# Patient Record
Sex: Male | Born: 1945 | Race: White | Hispanic: No | Marital: Married | Smoking: Former smoker
Health system: Southern US, Community
[De-identification: ages and names within clinical notes are randomized; demographics above are authoritative.]

## PROBLEM LIST (undated history)

## (undated) DIAGNOSIS — I4891 Unspecified atrial fibrillation: Secondary | ICD-10-CM

## (undated) DIAGNOSIS — F0390 Unspecified dementia without behavioral disturbance: Secondary | ICD-10-CM

---

## 2013-02-01 ENCOUNTER — Encounter (HOSPITAL_COMMUNITY): Payer: Self-pay | Admitting: Emergency Medicine

## 2013-02-01 ENCOUNTER — Emergency Department (HOSPITAL_COMMUNITY): Payer: Medicare Other

## 2013-02-01 ENCOUNTER — Emergency Department (HOSPITAL_COMMUNITY)
Admission: EM | Admit: 2013-02-01 | Discharge: 2013-02-01 | Disposition: A | Discharge status: Skilled Nursing Facility | Payer: Medicare Other | Attending: Emergency Medicine | Admitting: Emergency Medicine

## 2013-02-01 DIAGNOSIS — S0093XA Contusion of unspecified part of head, initial encounter: Secondary | ICD-10-CM

## 2013-02-01 DIAGNOSIS — Y92009 Unspecified place in unspecified non-institutional (private) residence as the place of occurrence of the external cause: Secondary | ICD-10-CM | POA: Insufficient documentation

## 2013-02-01 DIAGNOSIS — Z79899 Other long term (current) drug therapy: Secondary | ICD-10-CM | POA: Insufficient documentation

## 2013-02-01 DIAGNOSIS — IMO0002 Reserved for concepts with insufficient information to code with codable children: Secondary | ICD-10-CM | POA: Insufficient documentation

## 2013-02-01 DIAGNOSIS — S20312A Abrasion of left front wall of thorax, initial encounter: Secondary | ICD-10-CM

## 2013-02-01 DIAGNOSIS — W1809XA Striking against other object with subsequent fall, initial encounter: Secondary | ICD-10-CM | POA: Insufficient documentation

## 2013-02-01 DIAGNOSIS — Y9389 Activity, other specified: Secondary | ICD-10-CM | POA: Insufficient documentation

## 2013-02-01 DIAGNOSIS — W19XXXA Unspecified fall, initial encounter: Secondary | ICD-10-CM

## 2013-02-01 DIAGNOSIS — S0003XA Contusion of scalp, initial encounter: Secondary | ICD-10-CM | POA: Insufficient documentation

## 2013-02-01 DIAGNOSIS — S1093XA Contusion of unspecified part of neck, initial encounter: Secondary | ICD-10-CM | POA: Insufficient documentation

## 2013-02-01 DIAGNOSIS — F0390 Unspecified dementia without behavioral disturbance: Secondary | ICD-10-CM | POA: Insufficient documentation

## 2013-02-01 DIAGNOSIS — Z87891 Personal history of nicotine dependence: Secondary | ICD-10-CM | POA: Insufficient documentation

## 2013-02-01 DIAGNOSIS — Z8679 Personal history of other diseases of the circulatory system: Secondary | ICD-10-CM | POA: Insufficient documentation

## 2013-02-01 HISTORY — DX: Unspecified dementia, unspecified severity, without behavioral disturbance, psychotic disturbance, mood disturbance, and anxiety: F03.90

## 2013-02-01 HISTORY — DX: Unspecified atrial fibrillation: I48.91

## 2013-02-01 NOTE — ED Notes (Signed)
Report called to [facility]. 

## 2013-02-01 NOTE — ED Notes (Signed)
Pt was standing up and fell hitting his head on the floor and has a hematoma to back side of head. Pt is alert and oriented, no loc, was able to get back up per protocol was sent to ED. Come from [facility]

## 2013-02-01 NOTE — ED Provider Notes (Signed)
History    CSN: [id number] Arrival date & time 02/01/13  1657  First MD Initiated Contact with Patient 02/01/13 1720      Chief complaint fall  Level V caveat for dementia  (Consider location/radiation/quality/duration/timing/severity/associated sxs/prior Treatment) HPI  Wife states she sees the patient daily at his memory unit where he resides for his dementia. She states today and characteristically he was a little bit more aggressive than usual and he was given Seroquel 25 mg that he has as a when necessary order. She states they normally give him a larger dose at bedtime. Within about 30 minutes he fell and hit the back of his head. She states she also noticed he has a scratch on his left chest. She reports he is evaluated frequently there and had a recent urinalysis for possible UTI which was normal. She states it is their policy to have any resident checked when they fall.    PCP Dr Sonia Baller  Past Medical History  Diagnosis Date  . Dementia   . Atrial fibrillation    History reviewed. No pertinent past surgical history.   History reviewed. No pertinent family history.   History  Substance Use Topics  . Smoking status: Former Games developer  . Smokeless tobacco: Not on file  . 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    Allergies  Review of patient's allergies indicates no known allergies.  Home Medications   Current Outpatient Rx  Name  Route  Sig  Dispense  Refill  . ibuprofen (ADVIL,MOTRIN) 200 MG tablet   Oral   Take 200 mg by mouth daily as needed for pain.         Marland Kitchen ibuprofen (ADVIL,MOTRIN) 400 MG tablet   Oral   Take 400 mg by mouth 3 (three) times daily.         Marland Kitchen levothyroxine (SYNTHROID, LEVOTHROID) 25 MCG tablet   Oral   Take 25 mcg by mouth daily before breakfast.         . PARoxetine (PAXIL) 10 MG tablet   Oral   Take 10 mg by mouth at bedtime.  Taken with 40mg  tab to equal 50mg  qhs.         Marland Kitchen PARoxetine (PAXIL) 40 MG tablet   Oral   Take 40 mg by mouth at bedtime. Taken with 10mg  tab to equal 50mg  qhs.         . QUEtiapine (SEROQUEL) 100 MG tablet   Oral   Take 100 mg by mouth at bedtime.         Marland Kitchen QUEtiapine (SEROQUEL) 25 MG tablet   Oral   Take 12.5 mg by mouth 2 (two) times daily as needed (for anxiety/agitation).          BP 128/84  Pulse 68  Temp(Src) 98.5 F (36.9 C) (Oral)  Resp 16  Ht 5\' 11"  (1.803 m)  Wt 170 lb (77.111 kg)  BMI 23.72 kg/m2  SpO2 96%  Vital signs normal   Physical Exam  Nursing note and vitals reviewed. Constitutional: He appears well-developed and well-nourished.  Non-toxic appearance. He does not appear ill. No distress.  HENT:  Head: Normocephalic.    Right Ear: External ear normal.  Left Ear: External ear normal.  Nose: Nose normal. No mucosal edema or rhinorrhea.  Mouth/Throat: Oropharynx is clear and moist and mucous membranes are normal. No dental abscesses or edematous.  Patient has a reddened area of his posterior scalp where he fell, there  are no open lesions.  Eyes: Conjunctivae and EOM are normal. Pupils are equal, round, and reactive to light.  Neck: Normal range of motion and full passive range of motion without pain. Neck supple.  Cardiovascular: Normal rate, regular rhythm and normal heart sounds.  Exam reveals no gallop and no friction rub.   No murmur heard. Pulmonary/Chest: Effort normal and breath sounds normal. No respiratory distress. He has no wheezes. He has no rhonchi. He has no rales. He exhibits no tenderness and no crepitus.    Linear superficial abrasion vertically placed along his left lateral rib cage without swelling or crepitance  Abdominal: Soft. Normal appearance and bowel sounds are normal. He exhibits no distension. There is no tenderness. There is no rebound and no guarding.  Musculoskeletal: Normal range of motion. He exhibits no edema  and no tenderness.  Patient appears to be uncomfortable when I try to flex his right hip. Wife states he has arthritis and he has pain that he takes ibuprofen for  Neurological: He is alert. He has normal strength. No cranial nerve deficit.  Skin: Skin is warm, dry and intact. No rash noted. No erythema. No pallor.  Psychiatric: His speech is normal. His mood appears not anxious.  Patient has very flat affect and appears to be sedated.    ED Course  Procedures (including critical care time)  Wife is agreeable of doing a CT scan of his head and x-rays of the areas that appeared to be having an acute injury.   Dg Chest 1 View  02/01/2013   *RADIOLOGY REPORT*  Clinical Data: Fall, dimension, atrial fibrillation  CHEST - 1 VIEW  Comparison: None.  Findings: Low volume exam with mild cardiomegaly and vascular congestion.  No CHF.  Minimal basilar atelectasis.  No focal pneumonia, collapse or consolidation.  No effusion or pneumothorax. Degenerative changes of the shoulders.  IMPRESSION: Low lung volumes with cardiomegaly and vascular congestion   Original Report Authenticated By: Judie Petit. Miles Costain, M.D.   Ct Head Wo Contrast  02/01/2013   *RADIOLOGY REPORT*  Clinical Data: Fall, head injury  CT HEAD WITHOUT CONTRAST  Technique:  Contiguous axial images were obtained from the base of the skull through the vertex without contrast.  Comparison: None.  Findings: Limited exam because of oblique acquisition.  Brain atrophy evident without acute intracranial hemorrhage, mass lesion, definite infarction, midline shift, herniation, hydrocephalus, or extra-axial fluid collection.  Gray-white matter differentiation maintained.  Cisterns patent.  Cerebellar atrophy as well. Symmetric orbits.  Mastoids and sinuses clear.  No skull abnormality.  IMPRESSION: Atrophy without acute intracranial process   Original Report Authenticated By: Judie Petit. Miles Costain, M.D.   Dg Pelvis Portable  02/01/2013   *RADIOLOGY REPORT*  Clinical Data:  Fall, hip pain  PORTABLE PELVIS  Comparison: None.  Findings: Hips appear symmetric and located.  No displaced fracture.  Visualized bony pelvis intact.  Postop changes in the pelvis.  Normal SI joints.  Nonobstructive bowel gas pattern.  IMPRESSION: No acute osseous finding   Original Report Authenticated By: Judie Petit. Miles Costain, M.D.   1. Fall at nursing home, initial encounter   2. Contusion of head, initial encounter   3. Abrasion of chest wall, left, initial encounter     Plan discharge  Devoria Albe, MD, FACEP   MDM    Ward Givens, MD 02/01/13 [phone]

## 2014-03-05 ENCOUNTER — Encounter (HOSPITAL_COMMUNITY): Payer: Self-pay | Admitting: Emergency Medicine

## 2014-03-05 ENCOUNTER — Emergency Department (HOSPITAL_COMMUNITY)
Admission: EM | Admit: 2014-03-05 | Discharge: 2014-03-05 | Disposition: A | Discharge status: Home / Self Care | Payer: Medicare Other | Attending: Emergency Medicine | Admitting: Emergency Medicine

## 2014-03-05 DIAGNOSIS — Z79899 Other long term (current) drug therapy: Secondary | ICD-10-CM | POA: Diagnosis not present

## 2014-03-05 DIAGNOSIS — S0191XA Laceration without foreign body of unspecified part of head, initial encounter: Secondary | ICD-10-CM

## 2014-03-05 DIAGNOSIS — Y929 Unspecified place or not applicable: Secondary | ICD-10-CM | POA: Diagnosis not present

## 2014-03-05 DIAGNOSIS — S0990XA Unspecified injury of head, initial encounter: Secondary | ICD-10-CM | POA: Insufficient documentation

## 2014-03-05 DIAGNOSIS — Y9389 Activity, other specified: Secondary | ICD-10-CM | POA: Insufficient documentation

## 2014-03-05 DIAGNOSIS — W1809XA Striking against other object with subsequent fall, initial encounter: Secondary | ICD-10-CM | POA: Insufficient documentation

## 2014-03-05 DIAGNOSIS — Z792 Long term (current) use of antibiotics: Secondary | ICD-10-CM | POA: Insufficient documentation

## 2014-03-05 DIAGNOSIS — F0390 Unspecified dementia without behavioral disturbance: Secondary | ICD-10-CM | POA: Diagnosis not present

## 2014-03-05 DIAGNOSIS — Z8679 Personal history of other diseases of the circulatory system: Secondary | ICD-10-CM | POA: Diagnosis not present

## 2014-03-05 DIAGNOSIS — S0180XA Unspecified open wound of other part of head, initial encounter: Secondary | ICD-10-CM | POA: Diagnosis present

## 2014-03-05 DIAGNOSIS — W050XXA Fall from non-moving wheelchair, initial encounter: Secondary | ICD-10-CM | POA: Diagnosis not present

## 2014-03-05 DIAGNOSIS — Z87891 Personal history of nicotine dependence: Secondary | ICD-10-CM | POA: Insufficient documentation

## 2014-03-05 NOTE — ED Notes (Signed)
Bed: WA03 Expected date:  Expected time:  Means of arrival:  Comments: EMS FALL 

## 2014-03-05 NOTE — ED Notes (Signed)
[facility] called  

## 2014-03-05 NOTE — ED Provider Notes (Signed)
CSN: [id number]     Arrival date & time 03/05/14  1120 History   First MD Initiated Contact with Patient 03/05/14 1159     Chief Complaint  Patient presents with  . Fall  . Head Laceration    Level V caveat: Dementia  HPI This is brought to the emergency department after a fall from his wheelchair where he fell forward.  Head laceration noted by family.  No use of anticoagulants.  Patient has a history of  dementia and is unable to provide any reasonable history.  Majority of history is obtained from his wife.  Patient is a DO NOT RESUSCITATE.  Wife reports no alteration in his mental status from baseline.   Past Medical History  Diagnosis Date  . Dementia   . Atrial fibrillation    History reviewed. No pertinent past surgical history. No family history on file. History  Substance Use Topics  . Smoking status: Former Games developer  . Smokeless tobacco: Not on file  . Alcohol Use: No    Review of Systems  All other systems reviewed and are negative.     Allergies  Review of patient's allergies indicates no known allergies.  Home Medications   Prior to Admission medications   Medication Sig Start Date End Date Taking? Authorizing Provider  ciprofloxacin (CIPRO) 250 MG tablet Take 250 mg by mouth every morning. 02/28/14 05/31/14 Yes Historical Provider, MD  ibuprofen (ADVIL,MOTRIN) 400 MG tablet Take 400 mg by mouth 2 (two) times daily.    Yes Historical Provider, MD  levothyroxine (SYNTHROID, LEVOTHROID) 25 MCG tablet Take 25 mcg by mouth daily before breakfast.   Yes Historical Provider, MD  liver oil-zinc oxide (DESITIN) 40 % ointment Apply 1 application topically 2 (two) times daily.   Yes Historical Provider, MD  PARoxetine (PAXIL) 10 MG tablet Take 10 mg by mouth every morning.    Yes Historical Provider, MD  QUEtiapine (SEROQUEL) 50 MG tablet Take 50-100 mg by mouth 2 (two) times daily. Take 2 tabs (100mg ) in the morning and 1 tab (50mg ) in the evening.   Yes Historical  Provider, MD   BP 107/75  Pulse 83  Temp(Src) 97.9 F (36.6 C) (Oral)  Resp 18  SpO2 95% Physical Exam  Nursing note and vitals reviewed. Constitutional: He appears well-developed and well-nourished.  HENT:  Head: Normocephalic.  Eyes: EOM are normal.  Neck: Normal range of motion.  No cervical spine tenderness or step-off  Pulmonary/Chest: Effort normal.  Abdominal: He exhibits no distension.  Musculoskeletal: Normal range of motion.  Full range of motion bilateral knees and hips.  Full range of motion of bilateral elbows and shoulders.  Neurological: He is alert.  Does not follow commands.  All 4 extremities noted to move  Psychiatric: He has a normal mood and affect.    ED Course  Procedures (including critical care time)  LACERATION REPAIR Performed by: Lyanne Co Consent: Verbal consent obtained. Risks and benefits: risks, benefits and alternatives were discussed Patient identity confirmed: provided demographic data Time out performed prior to procedure Prepped and Draped in normal sterile fashion Wound explored Laceration Location: Forehead Laceration Length: 3.5 cm No Foreign Bodies seen or palpated Anesthesia: None  Irrigation method: syringe Amount of cleaning: standard Skin closure: Staples  Number of sutures or staples: 5  Technique: Staples  Patient tolerance: Patient tolerated the procedure well with no immediate complications.  Labs Review Labs Reviewed - No data to display  Imaging Review No results found.   EKG  Interpretation None      MDM   Final diagnoses:  Laceration of head, initial encounter  Minor head injury, initial encounter    Long discussion was had with the wife.  Patient is a DO NOT RESUSCITATE.  She states she would not be a candidate for intercranial surgery if there was a head bleed.  She would prefer not to have a head CT performed.  Laceration repaired.  Discharge home in good condition.    Lyanne CoKevin M Jourdin Connors,  MD 03/05/14 1245

## 2014-03-05 NOTE — ED Notes (Addendum)
Per EMS, pt fell from his wheelchair and hit his head on the floor. [facility] staff witnessed fall. Staff deny loss of consciousness. Pt has a ~1 inch laceration on forehead, bleeding has been controlled. Pt has hx of Lewy Body dementia. Pt not on blood thinners.

## 2014-03-11 IMAGING — CT CT HEAD W/O CM
2 series · 17 of 30 positions shown, 20 images · non-contrast
Comparison: None.

CLINICAL DATA: Fall, head injury

CT HEAD WITHOUT CONTRAST
TECHNIQUE: Contiguous axial images were obtained from the base of
the skull through the vertex without contrast.

[Series 2: head w/o · axial · non-contrast · 0.43mm/px · z∈[-168,-33]mm · 9 of 35 slices shown, 12 images]
[im 4/35  brain]
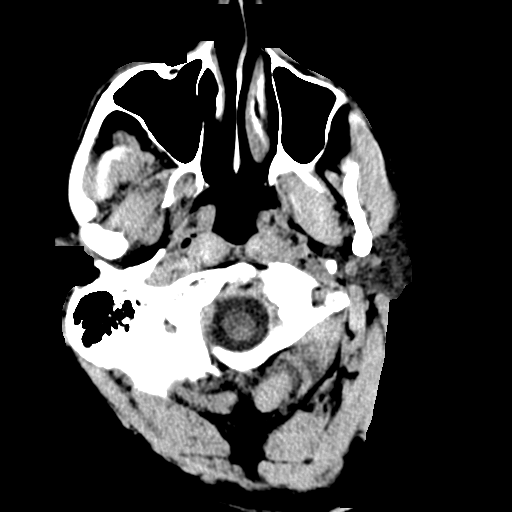
[im 4/35  bone]
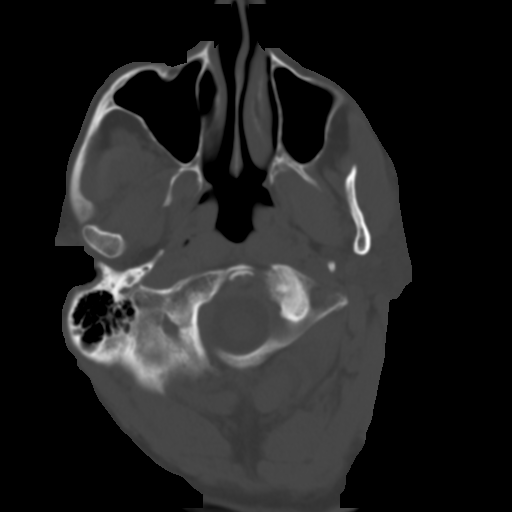
[im 7/35  brain]
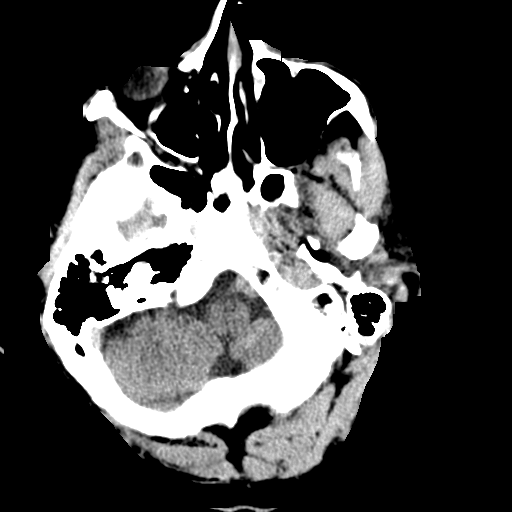
[im 11/35  brain]
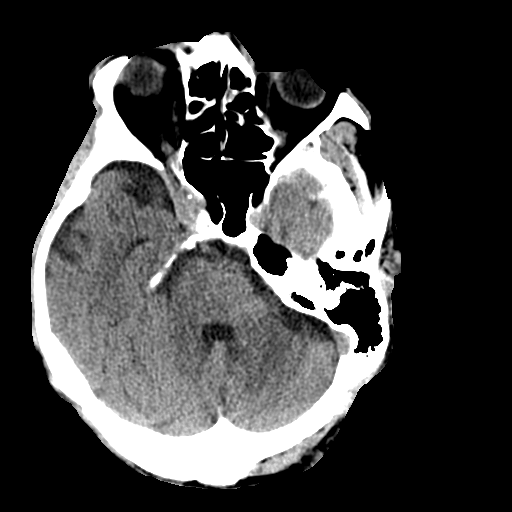
[im 14/35  brain]
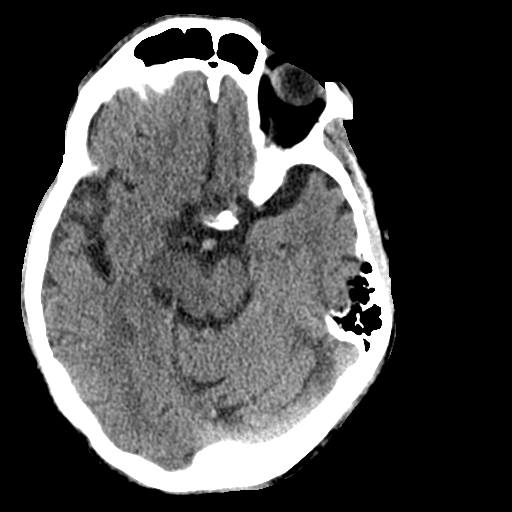
[im 18/35  brain]
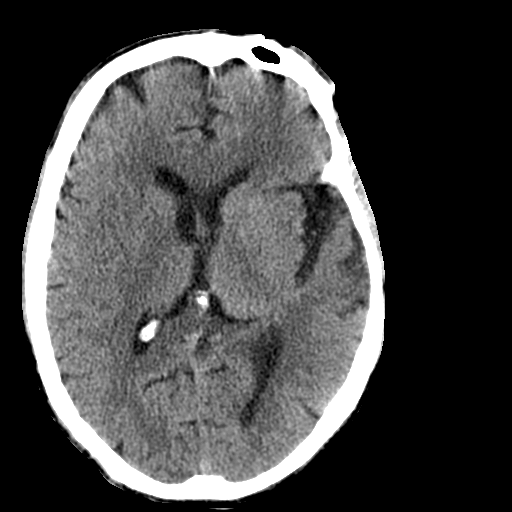
[im 18/35  bone]
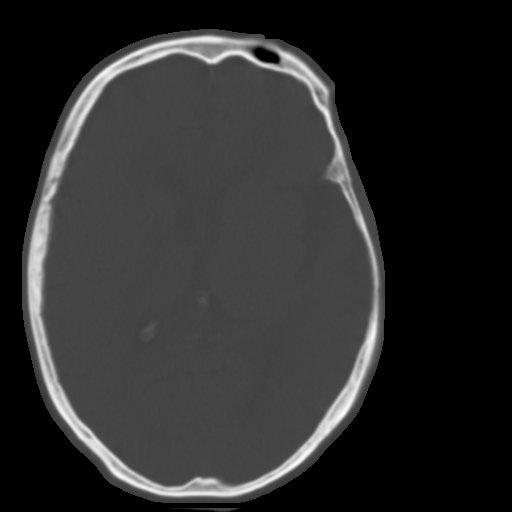
[im 21/35  brain]
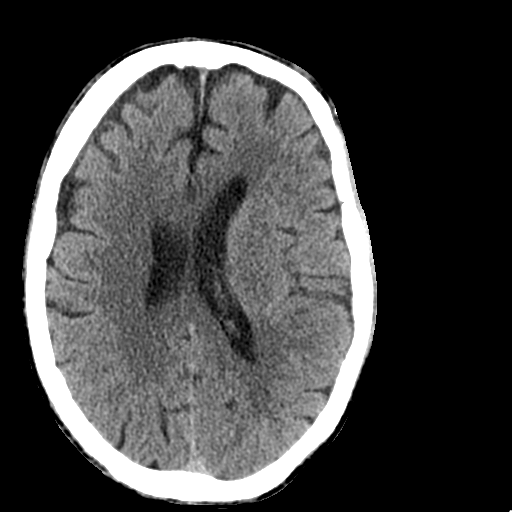
[im 24/35  brain]
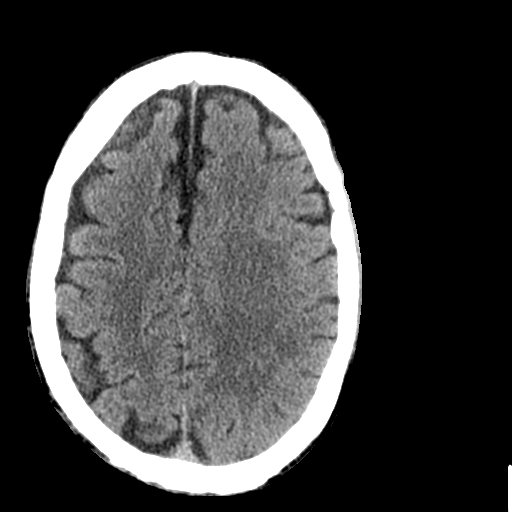
[im 28/35  brain]
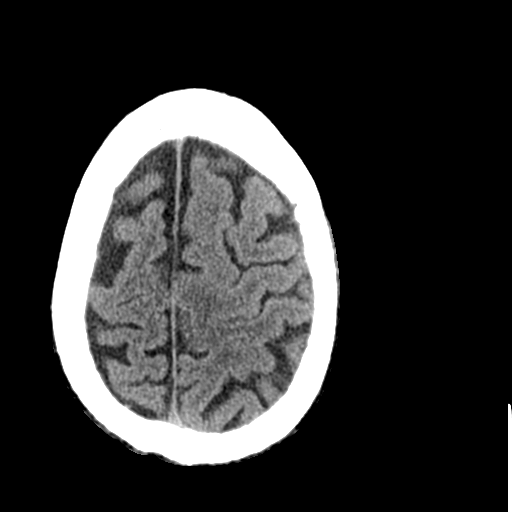
[im 31/35  brain]
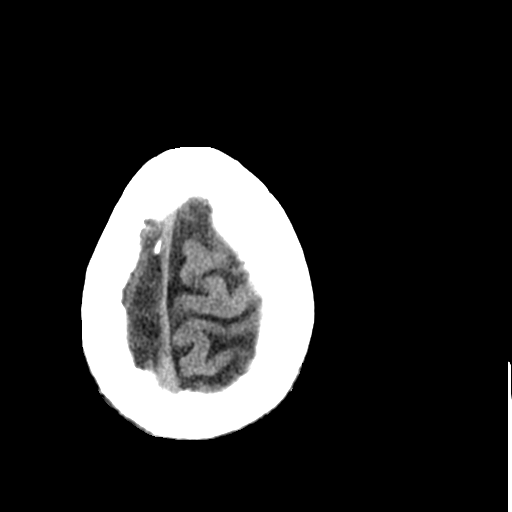
[im 31/35  bone]
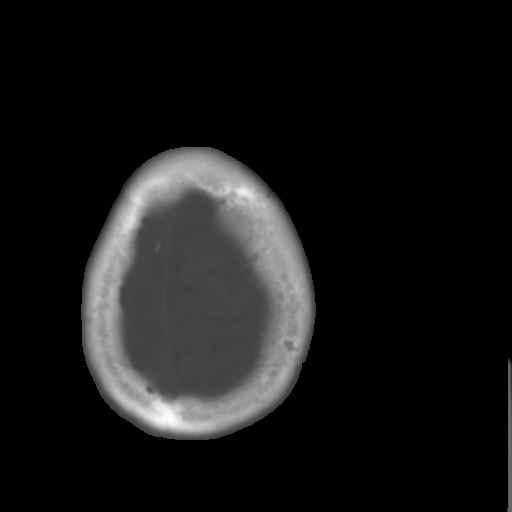

[Series 3: bone windows · axial · 0.43mm/px · z∈[-165,-36]mm · 8 of 57 slices shown]
[im 7/57  bone]
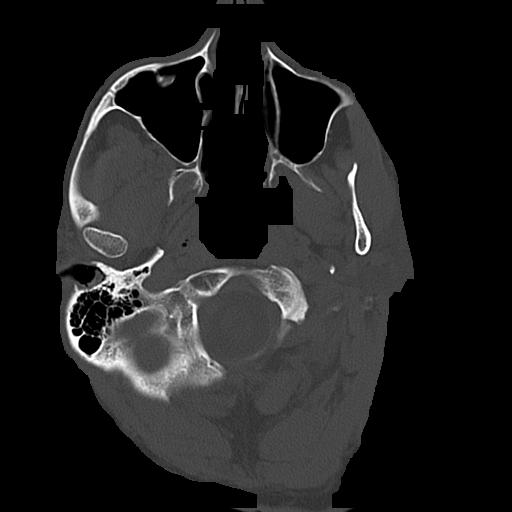
[im 13/57  bone]
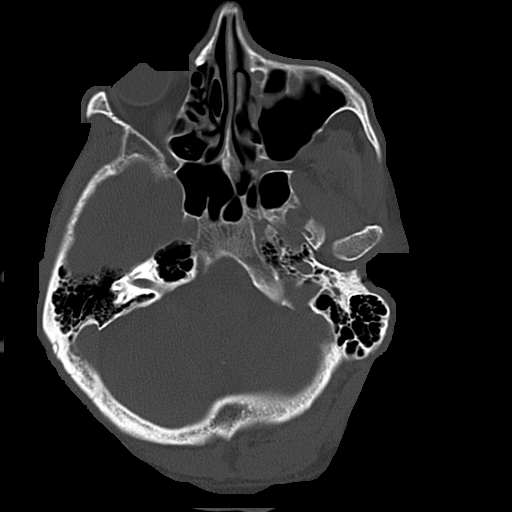
[im 19/57  bone]
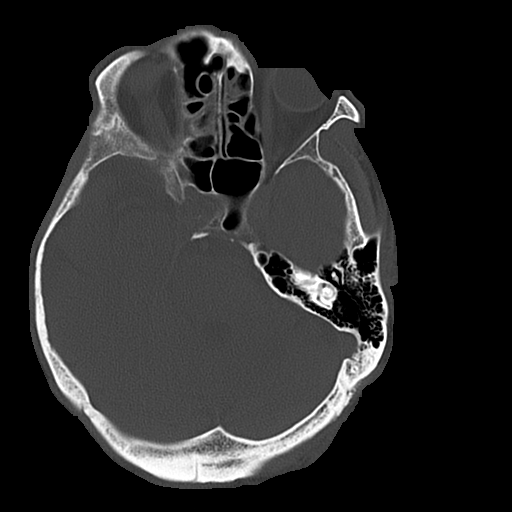
[im 25/57  bone]
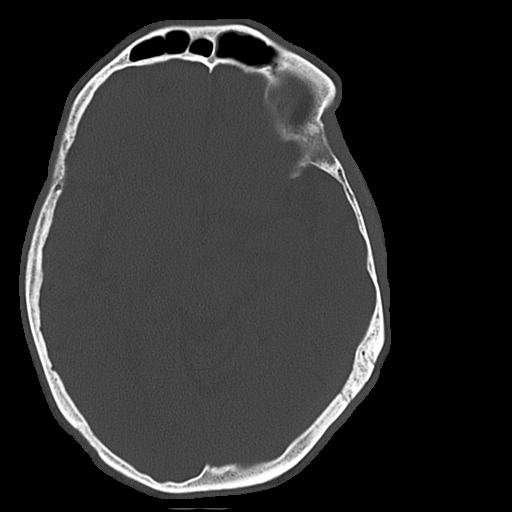
[im 32/57  bone]
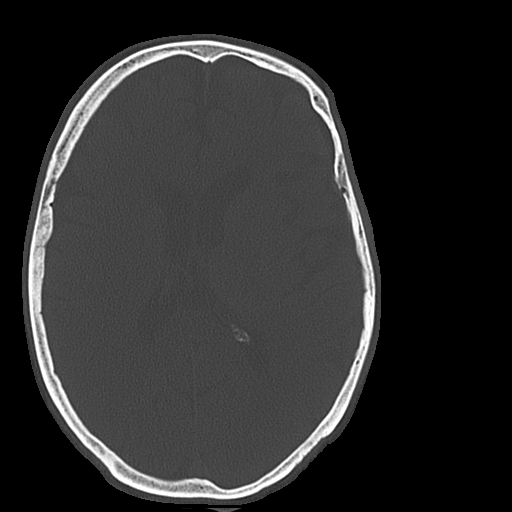
[im 38/57  bone]
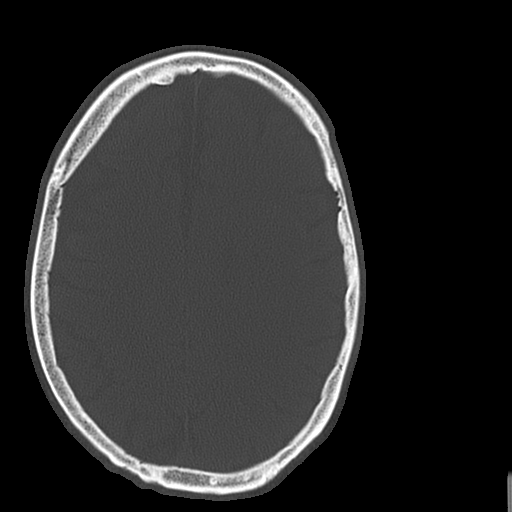
[im 44/57  bone]
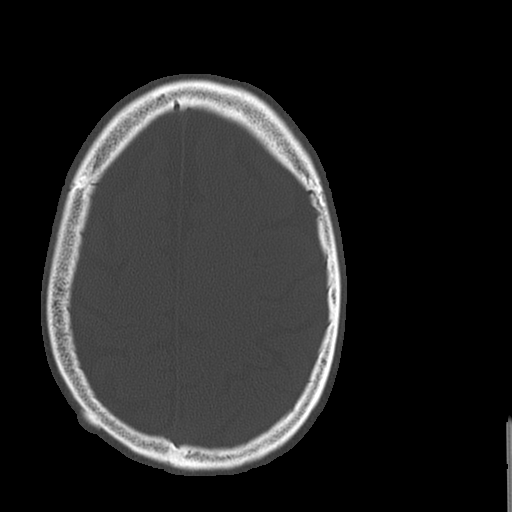
[im 50/57  bone]
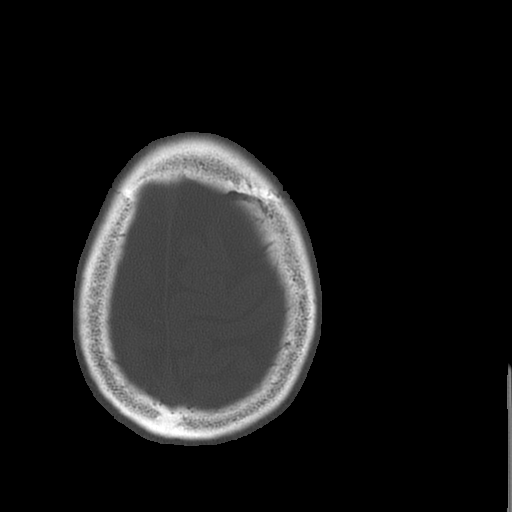

[17 of 30 positions shown; findings below may reference images not displayed]

FINDINGS: Limited exam because of oblique acquisition.  Brain
atrophy evident without acute intracranial hemorrhage, mass lesion,
definite infarction, midline shift, herniation, hydrocephalus, or
extra-axial fluid collection.  Gray-white matter differentiation
maintained.  Cisterns patent.  Cerebellar atrophy as well.
Symmetric orbits.  Mastoids and sinuses clear.  No skull
abnormality.
IMPRESSION: Atrophy without acute intracranial process

## 2014-03-11 IMAGING — CR DG PORTABLE PELVIS
2 series · 2 of 2 positions shown · non-contrast
Comparison: None.

CLINICAL DATA: Fall, hip pain

PORTABLE PELVIS

[x pelvis (1 of 2)]
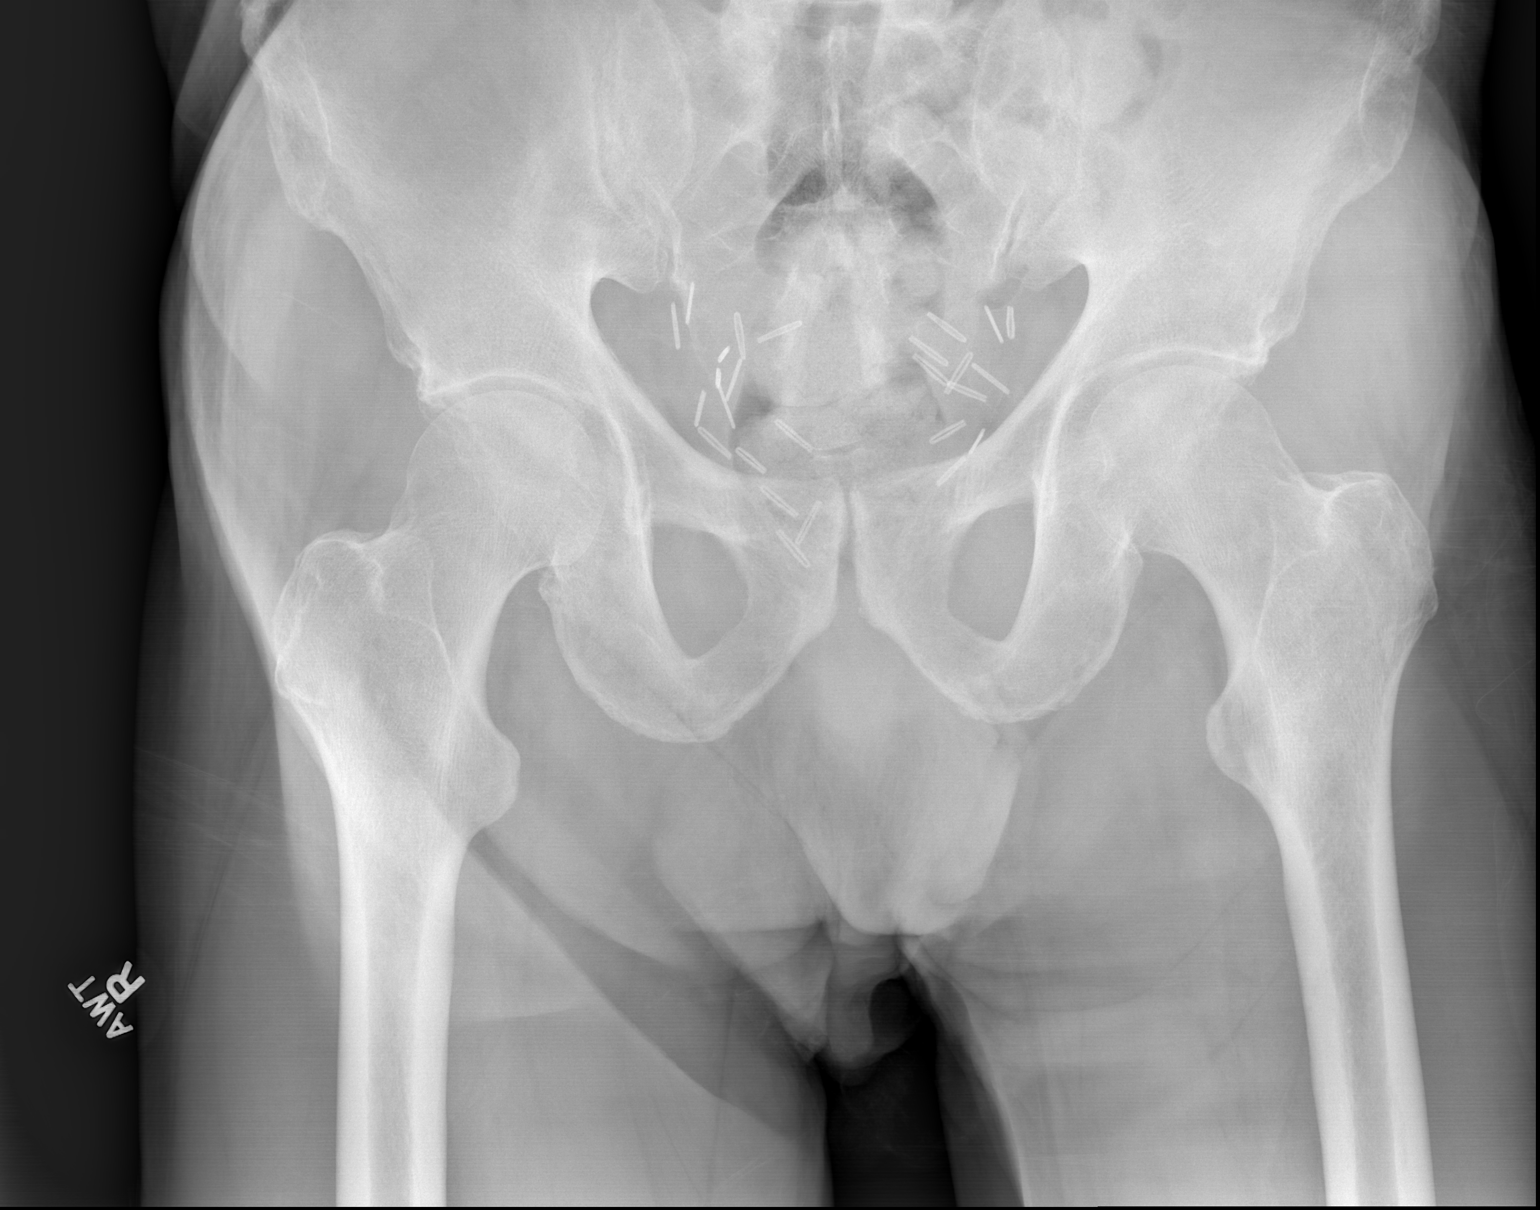

[x pelvis (2 of 2)]
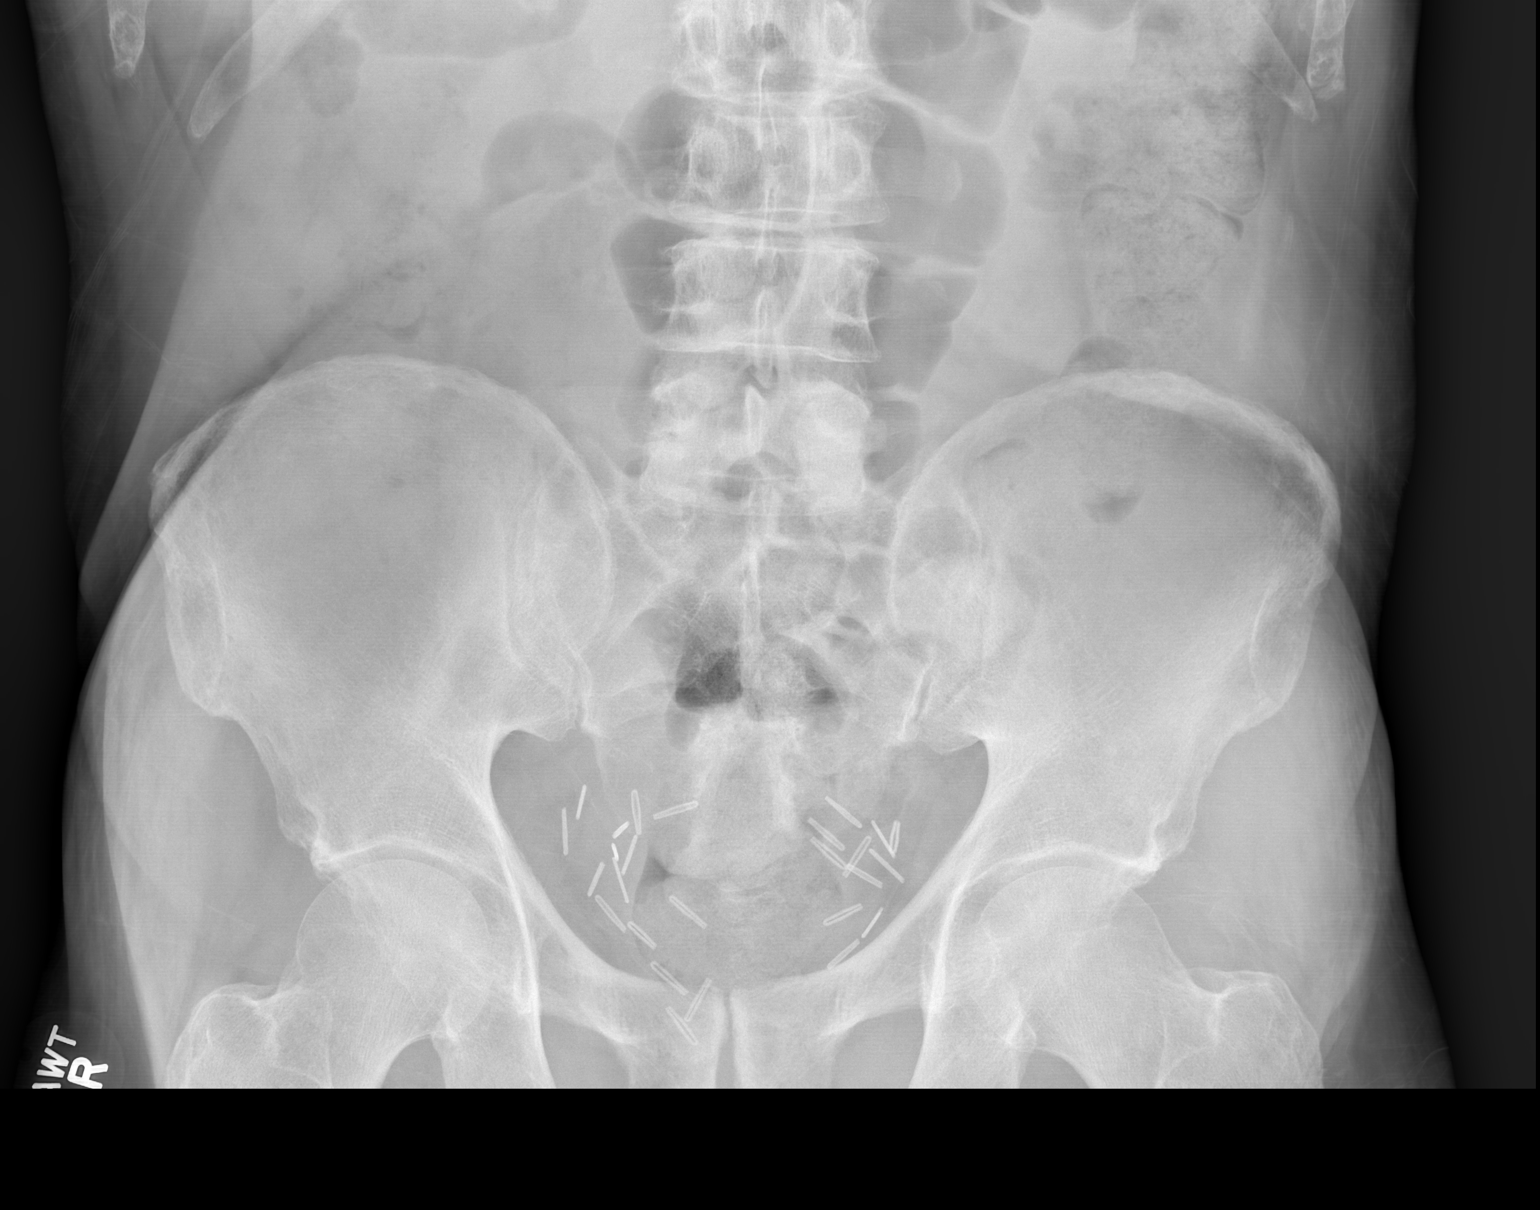

[2 of 2 positions shown; findings below may reference images not displayed]

FINDINGS: Hips appear symmetric and located.  No displaced
fracture.  Visualized bony pelvis intact.  Postop changes in the
pelvis.  Normal SI joints.  Nonobstructive bowel gas pattern.
IMPRESSION: No acute osseous finding

## 2017-01-08 DEATH — deceased
